# Patient Record
Sex: Female | Born: 1955 | Race: Black or African American | Hispanic: No | Marital: Single | Smoking: Never smoker
Health system: Southern US, Community
[De-identification: ages and names within clinical notes are randomized; demographics above are authoritative.]

## PROBLEM LIST (undated history)

## (undated) DIAGNOSIS — D219 Benign neoplasm of connective and other soft tissue, unspecified: Secondary | ICD-10-CM

## (undated) DIAGNOSIS — D649 Anemia, unspecified: Secondary | ICD-10-CM

## (undated) DIAGNOSIS — I10 Essential (primary) hypertension: Secondary | ICD-10-CM

## (undated) HISTORY — DX: Anemia, unspecified: D64.9

## (undated) HISTORY — DX: Benign neoplasm of connective and other soft tissue, unspecified: D21.9

## (undated) HISTORY — DX: Essential (primary) hypertension: I10

---

## 1999-07-16 ENCOUNTER — Emergency Department (HOSPITAL_COMMUNITY): Admission: EM | Admit: 1999-07-16 | Discharge: 1999-07-16 | Payer: Self-pay | Admitting: Emergency Medicine

## 2003-02-24 ENCOUNTER — Ambulatory Visit (HOSPITAL_COMMUNITY): Admission: RE | Admit: 2003-02-24 | Discharge: 2003-02-24 | Payer: Self-pay | Admitting: *Deleted

## 2011-04-10 ENCOUNTER — Other Ambulatory Visit (HOSPITAL_COMMUNITY)
Admission: RE | Admit: 2011-04-10 | Discharge: 2011-04-10 | Disposition: A | Discharge status: Home / Self Care | Payer: BC Managed Care – PPO | Source: Ambulatory Visit | Attending: Obstetrics and Gynecology | Admitting: Obstetrics and Gynecology

## 2011-04-10 ENCOUNTER — Ambulatory Visit (INDEPENDENT_AMBULATORY_CARE_PROVIDER_SITE_OTHER): Payer: BC Managed Care – PPO | Admitting: Women's Health

## 2011-04-10 VITALS — BP 124/72 | Ht 64.75 in | Wt 196.0 lb

## 2011-04-10 DIAGNOSIS — Z01419 Encounter for gynecological examination (general) (routine) without abnormal findings: Secondary | ICD-10-CM

## 2011-04-10 DIAGNOSIS — N949 Unspecified condition associated with female genital organs and menstrual cycle: Secondary | ICD-10-CM

## 2011-04-10 DIAGNOSIS — N938 Other specified abnormal uterine and vaginal bleeding: Secondary | ICD-10-CM

## 2011-04-10 DIAGNOSIS — Z23 Encounter for immunization: Secondary | ICD-10-CM

## 2011-04-10 NOTE — Patient Instructions (Signed)
Vit d 2000

## 2011-04-10 NOTE — Progress Notes (Signed)
Addended by: [place], Fredy Gladu E on: 04/10/2011 12:55 PM   Modules accepted: Orders

## 2011-04-10 NOTE — Progress Notes (Signed)
Carrie Hess 17-Jan-1956 [id number]    History:    The patient presents for annual exam.  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   Past medical history, past surgical history, family history and social history were all reviewed and documented in the EPIC chart.   ROS:  A  ROS was performed and pertinent positives and negatives are included in the history.  Exam:  Filed Vitals:   04/10/11 1000  BP: 124/72    General appearance:  Normal Head/Neck:  Normal, without cervical or supraclavicular adenopathy. Thyroid:  Symmetrical, normal in size, without palpable masses or nodularity. Respiratory  Effort:  Normal  Auscultation:  Clear without wheezing or rhonchi Cardiovascular  Auscultation:  Regular rate, without rubs, murmurs or gallops  Edema/varicosities:  Not grossly evident Abdominal  Soft,nontender, without masses, guarding or rebound.  Liver/spleen:  No organomegaly noted  Hernia:  None appreciated  Skin  Inspection:  Grossly normal  Palpation:  Grossly normal Neurologic/psychiatric  Orientation:  Normal with appropriate conversation.  Mood/affect:  Normal  Genitourinary    Breasts: Examined lying and sitting.     Right: Without masses, retractions, discharge or axillary adenopathy.     Left: Without masses, retractions, discharge or axillary adenopathy.   Inguinal/mons:  Normal without inguinal adenopathy  External genitalia:  Normal  BUS/Urethra/Skene's glands:  Normal  Bladder:  Normal  Vagina:  Normal  Cervix:  Normal  Uterus:  Enlarged/fibroid 8-10 weeks size, shape and contour.  Midline and mobile  Adnexa/parametria:     Rt: Without masses or tenderness.   Lt: Without masses or tenderness.  Anus and perineum: Normal  Digital rectal exam: Normal sphincter tone without palpated masses or tenderness  Assessment/Plan:  55 y.o. SBF G0 for annual exam. Postmenopausal for greater than 2 years with occasional bleeding with exercise mostly. No HRT, tolerating symptoms  well. Moved here from [place]. Normal colonoscopy in 2009, normal DEXA in 2012.Tdap vaccine several years ago. History of all normal mammograms and Paps.  Postmenopausal bleeding/fibroids Hypertension/primary care labs and meds.  Plan: TSH, Pap. Sonohysterogram with Dr. Audie Box, will schedule. SBEs, annual mammogram, repeat DEXA next year. Continue exercise, vitamin D 2000 daily , calcium rich diet encouraged. Continue healthy lifestyle, decrease calories for weight loss.   Harrington Challenger WHNP, 11:15 AM 04/10/2011

## 2011-04-17 ENCOUNTER — Other Ambulatory Visit: Payer: Self-pay | Admitting: Gynecology

## 2011-04-17 DIAGNOSIS — E039 Hypothyroidism, unspecified: Secondary | ICD-10-CM

## 2011-04-18 LAB — TSH: TSH: 0.483 u[IU]/mL (ref 0.350–4.500)

## 2013-01-03 ENCOUNTER — Ambulatory Visit: Payer: BC Managed Care – PPO

## 2013-01-03 ENCOUNTER — Telehealth: Payer: Self-pay | Admitting: Radiology

## 2013-01-03 ENCOUNTER — Ambulatory Visit (INDEPENDENT_AMBULATORY_CARE_PROVIDER_SITE_OTHER): Payer: BC Managed Care – PPO | Admitting: Family Medicine

## 2013-01-03 VITALS — BP 116/80 | HR 80 | Temp 98.6°F | Resp 18 | Ht 64.5 in | Wt 194.0 lb

## 2013-01-03 DIAGNOSIS — J189 Pneumonia, unspecified organism: Secondary | ICD-10-CM

## 2013-01-03 DIAGNOSIS — R079 Chest pain, unspecified: Secondary | ICD-10-CM

## 2013-01-03 LAB — POCT CBC
Granulocyte percent: 77 %G (ref 37–80)
HCT, POC: 39.2 % (ref 37.7–47.9)
Hemoglobin: 12.4 g/dL (ref 12.2–16.2)
Lymph, poc: 2.3 (ref 0.6–3.4)
MCH, POC: 29.3 pg (ref 27–31.2)
MCHC: 31.6 g/dL — AB (ref 31.8–35.4)
MCV: 92.7 fL (ref 80–97)
MID (cbc): 0.8 (ref 0–0.9)
MPV: 9 fL (ref 0–99.8)
POC Granulocyte: 10.4 — AB (ref 2–6.9)
POC LYMPH PERCENT: 17.1 %L (ref 10–50)
POC MID %: 5.9 %M (ref 0–12)
Platelet Count, POC: 293 10*3/uL (ref 142–424)
RBC: 4.23 M/uL (ref 4.04–5.48)
RDW, POC: 14.5 %
WBC: 13.5 10*3/uL — AB (ref 4.6–10.2)

## 2013-01-03 LAB — GLUCOSE, POCT (MANUAL RESULT ENTRY): POC Glucose: 97 mg/dl (ref 70–99)

## 2013-01-03 MED ORDER — IBUPROFEN 600 MG PO TABS: 600.0000 mg | ORAL_TABLET | Freq: Three times a day (TID) | ORAL | Status: DC | PRN

## 2013-01-03 MED ORDER — CEFDINIR 300 MG PO CAPS: 300.0000 mg | ORAL_CAPSULE | Freq: Two times a day (BID) | ORAL | Status: DC

## 2013-01-03 NOTE — Telephone Encounter (Signed)
IMPRESSION:  1. Right middle lobe air space disease is suspicious for  pneumonia.  2. Nodular densities in both lung bases, most prominent on the  right.  3. Follow-up to clearing of the above findings is strongly  recommended, as clinically indicated, as underlying malignancy  cannot be excluded. These results will be called to the ordering  clinician or representative by the Radiologist Assistant, and  communication documented in the PACS Dashboard.  Clinically significant discrepancy from primary report, if  provided: None    Call report from Radiolology

## 2013-01-03 NOTE — Progress Notes (Signed)
[facility] [address], [place] [address] [phone]  Date:  01/03/2013   Name:  Carrie Hess   DOB:  17-Mar-1956   MRN:  [id number]  PCP:  Leodis Binet, DO    Chief Complaint: Chest Pain and Cough   History of Present Illness:  Carrie Hess is a 57 y.o. very pleasant female patient who presents with the following:  Today is Monday. Here with concern of chest pain for the last few days.  On Friday she was "not feeling well."  She worked out on Saturday and noted some chest pains AFTER her work- out.  During her work- out she felt like her legs were heavy and she could not do her usual routine.  She felt SOB after she finished.  The pain lasted for a couple of hours.  She felt like she needed to burp.  She felt that maybe her food just did not sit well.  She burped and felt better.    Laying on her side seemed to make the pain worse. She also noted body aches, fever and chills (subjective fever) on Saturday.   Last night she continued to feel bad, drank lots of water.  Took a baby aspirin.  She felt "strange," like she could not digest her food and needed to sit down.  She continued to have a "slight" chest pain.  She has noted some sneezing, but no cough unless she makes herself cough.  She does cough up some mucus.   She does not feel SOB at rest, but does feel that her energy is lower than usual.  She does have a history of HTN.  No history of CAD.  She is not quite sure what her BP medication is- called her pharm- she is on diovan HCT No family history of heart trouble.  She has never been a smoker.  No chest pain at present.   She usually runs 2 or 3 miles several times a week without CP or other difficulty.    There are no active problems to display for this patient.   Past Medical History  Diagnosis Date  . Hypertension   . Fibroid   . Anemia     History reviewed. No pertinent past surgical history.  History  Substance Use Topics   . Smoking status: Never Smoker   . Smokeless tobacco: Never Used  . Alcohol Use: Yes    Family History  Problem Relation Age of Onset  . Hypertension Mother   . Hyperlipidemia Mother   . Hypertension Father   . Diabetes Paternal Uncle   . Leukemia Paternal Uncle     No Known Allergies  Medication list has been reviewed and updated.  Current Outpatient Prescriptions on File Prior to Visit  Medication Sig Dispense Refill  . hydrochlorothiazide (MICROZIDE) 12.5 MG capsule Take 12.5 mg by mouth daily.         No current facility-administered medications on file prior to visit.    Review of Systems:  As per HPI- otherwise negative.   Physical Examination: Filed Vitals:   01/03/13 0811  BP: 116/80  Pulse: 84  Temp: 98.6 F (37 C)  Resp: 18   Filed Vitals:   01/03/13 0811  Height: 5' 4.5" (1.638 m)  Weight: 194 lb (87.998 kg)   Body mass index is 32.8 kg/(m^2). Ideal Body Weight: Weight in (lb) to have BMI = 25: 147.6  GEN: WDWN, NAD, Non-toxic, A & O  x 3, overweight, looks well HEENT: Atraumatic, Normocephalic. Neck supple. No masses, No LAD.  Bilateral TM wnl, oropharynx normal.  PEERL,EOMI.   Ears and Nose: No external deformity. CV: RRR, No M/G/R. No JVD. No thrill. No extra heart sounds. PULM: CTA B, no wheezes, crackles, rhonchi. No retractions. No resp. distress. No accessory muscle use. ABD: S, NT, ND EXTR: No c/c/e NEURO Normal gait.  PSYCH: Normally interactive. Conversant. Not depressed or anxious appearing.  Calm demeanor.   EKG: no old available for comparison.  Normal EKG, no ST elevation or depression  [facility] reading (PRIMARY) by  Dr. Patsy Lager. CXR: RLL pneumonia  IMPRESSION:  1. Right middle lobe air space disease is suspicious for  pneumonia.  2. Nodular densities in both lung bases, most prominent on the  right.  3. Follow-up to clearing of the above findings is strongly  recommended, as clinically indicated, as underlying malignancy   cannot be excluded. These results will be called to the ordering  clinician or representative by the Radiologist Assistant, and  communication documented in the PACS Dashboard.  Clinically significant discrepancy from primary report, if  provided: None    Results for orders placed in visit on 01/03/13  POCT CBC      Result Value Range   WBC 13.5 (*) 4.6 - 10.2 K/uL   Lymph, poc 2.3  0.6 - 3.4   POC LYMPH PERCENT 17.1  10 - 50 %L   MID (cbc) 0.8  0 - 0.9   POC MID % 5.9  0 - 12 %M   POC Granulocyte 10.4 (*) 2 - 6.9   Granulocyte percent 77.0  37 - 80 %G   RBC 4.23  4.04 - 5.48 M/uL   Hemoglobin 12.4  12.2 - 16.2 g/dL   HCT, POC 16.1  09.6 - 47.9 %   MCV 92.7  80 - 97 fL   MCH, POC 29.3  27 - 31.2 pg   MCHC 31.6 (*) 31.8 - 35.4 g/dL   RDW, POC 04.5     Platelet Count, POC 293  142 - 424 K/uL   MPV 9.0  0 - 99.8 fL  GLUCOSE, POCT (MANUAL RESULT ENTRY)      Result Value Range   POC Glucose 97  70 - 99 mg/dl    Assessment and Plan: CAP (community acquired pneumonia) - Plan: cefdinir (OMNICEF) 300 MG capsule  Chest pain - Plan: POCT CBC, POCT glucose (manual entry), DG Chest 2 View, EKG 12-Lead, ibuprofen (ADVIL,MOTRIN) 600 MG tablet  Pneumonia as likely cuase of her malaise and chest pain.  Discussed CXR as above and recommended that she have a repeat in one month.  Treat with omnicef for pneumonia.   Discussed less likely possibility of cardiac disease and offered to have her seen at the ER.  She declined at this time and feels comfortable with treatment for penumonia.    Signed Abbe Amsterdam, MD

## 2013-01-03 NOTE — Patient Instructions (Addendum)
You have pneumonia.  Please use the antibiotic as directed.  Let us know if you are not better, or if you are getting worse!   Remember to have a repeat chest- x-ray in about one month.  We can do this, or you can ask Dr. Ike Bene to do this for you.

## 2013-02-09 ENCOUNTER — Ambulatory Visit (INDEPENDENT_AMBULATORY_CARE_PROVIDER_SITE_OTHER): Payer: BC Managed Care – PPO | Admitting: Emergency Medicine

## 2013-02-09 ENCOUNTER — Ambulatory Visit: Payer: BC Managed Care – PPO

## 2013-02-09 VITALS — BP 134/87 | HR 63 | Temp 98.0°F | Resp 16 | Ht 64.5 in | Wt 196.0 lb

## 2013-02-09 DIAGNOSIS — Z23 Encounter for immunization: Secondary | ICD-10-CM

## 2013-02-09 DIAGNOSIS — J189 Pneumonia, unspecified organism: Secondary | ICD-10-CM

## 2013-02-09 MED ORDER — VITAMIN D (ERGOCALCIFEROL) 1.25 MG (50000 UNIT) PO CAPS: 50000.0000 [IU] | ORAL_CAPSULE | ORAL | Status: DC

## 2013-02-09 NOTE — Progress Notes (Signed)
[facility] [address], [place] [address] [phone]  Date:  02/09/2013   Name:  Carrie Hess   DOB:  05-03-1956   MRN:  [id number]  PCP:  Leodis Binet, DO    Chief Complaint: Follow-up   History of Present Illness:  Carrie Hess is a 57 y.o. very pleasant female patient who presents with the following:  Seen a month ago with Right pneumonia.  Completed her course of antibiotic and her symptoms have resolved.  No chest pain, shortness of breath, wheezing.  No fever or chills.  "feel fantastic".  Denies other complaint or health concern today.   There are no active problems to display for this patient.   Past Medical History  Diagnosis Date  . Hypertension   . Fibroid   . Anemia     History reviewed. No pertinent past surgical history.  History  Substance Use Topics  . Smoking status: Never Smoker   . Smokeless tobacco: Never Used  . Alcohol Use: Yes    Family History  Problem Relation Age of Onset  . Hypertension Mother   . Hyperlipidemia Mother   . Hypertension Father   . Diabetes Paternal Uncle   . Leukemia Paternal Uncle     No Known Allergies  Medication list has been reviewed and updated.  Current Outpatient Prescriptions on File Prior to Visit  Medication Sig Dispense Refill  . aspirin 81 MG tablet Take 81 mg by mouth daily.      Marland Kitchen ibuprofen (ADVIL,MOTRIN) 600 MG tablet Take 1 tablet (600 mg total) by mouth every 8 (eight) hours as needed for pain.  30 tablet  0  . valsartan-hydrochlorothiazide (DIOVAN-HCT) 80-12.5 MG per tablet Take 1 tablet by mouth daily.      . cefdinir (OMNICEF) 300 MG capsule Take 1 capsule (300 mg total) by mouth 2 (two) times daily.  20 capsule  0   No current facility-administered medications on file prior to visit.    Review of Systems:  As per HPI, otherwise negative.    Physical Examination: Filed Vitals:   02/09/13 0827  BP: 134/87  Pulse: 63  Temp: 98 F (36.7 C)  Resp:  16   Filed Vitals:   02/09/13 0827  Height: 5' 4.5" (1.638 m)  Weight: 196 lb (88.905 kg)   Body mass index is 33.14 kg/(m^2). Ideal Body Weight: Weight in (lb) to have BMI = 25: 147.6  GEN: WDWN, NAD, Non-toxic, A & O x 3 HEENT: Atraumatic, Normocephalic. Neck supple. No masses, No LAD. Ears and Nose: No external deformity. CV: RRR, No M/G/R. No JVD. No thrill. No extra heart sounds. PULM: CTA B, no wheezes, crackles, rhonchi. No retractions. No resp. distress. No accessory muscle use. ABD: S, NT, ND, +BS. No rebound. No HSM. EXTR: No c/c/e NEURO Normal gait.  PSYCH: Normally interactive. Conversant. Not depressed or anxious appearing.  Calm demeanor.    Assessment and Plan: Resolved pneumonia  Signed,  Phillips Odor, MD   [facility] reading (PRIMARY) by  Dr. Dareen Piano.  Negative chest.

## 2014-07-04 ENCOUNTER — Other Ambulatory Visit: Payer: Self-pay | Admitting: Emergency Medicine

## 2014-09-08 IMAGING — CR DG CHEST 2V
2 series · 2 of 2 positions shown · non-contrast
Comparison: None.

CLINICAL DATA: Chest pain, congestion and chills.

CHEST - 2 VIEW

[PA]
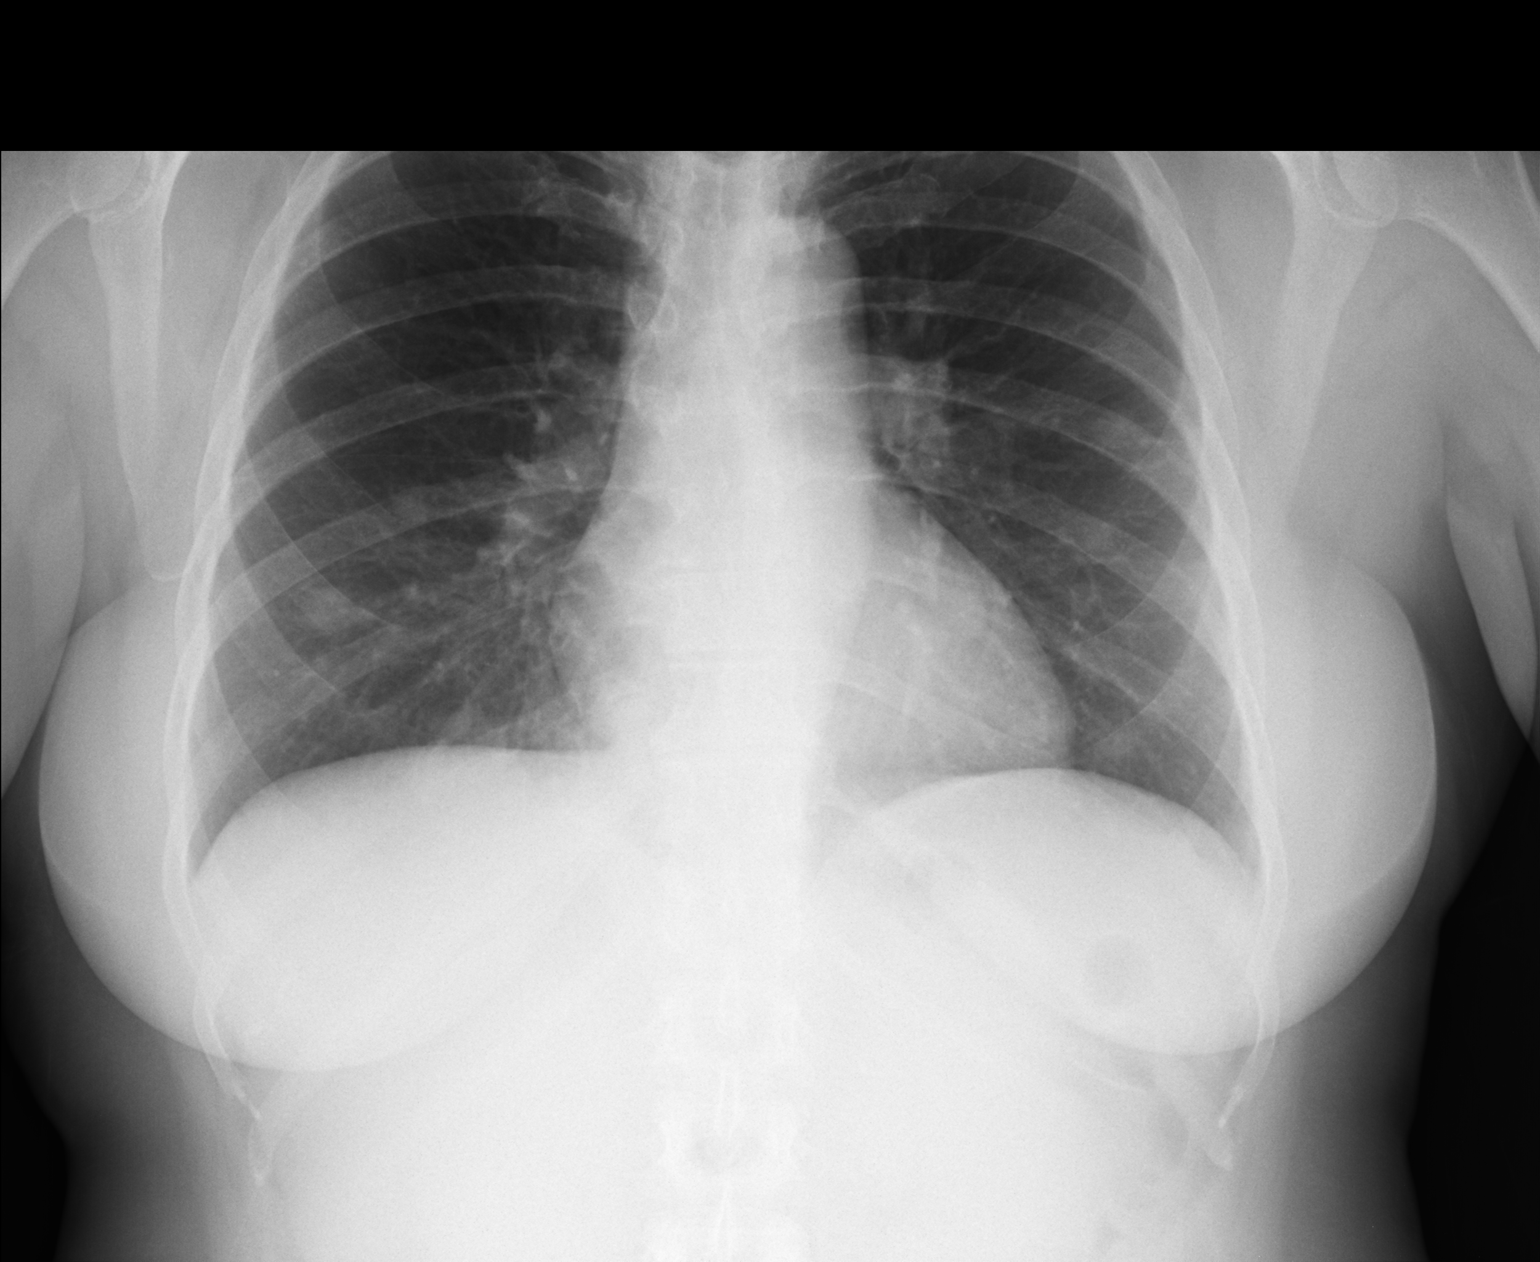

[lateral]
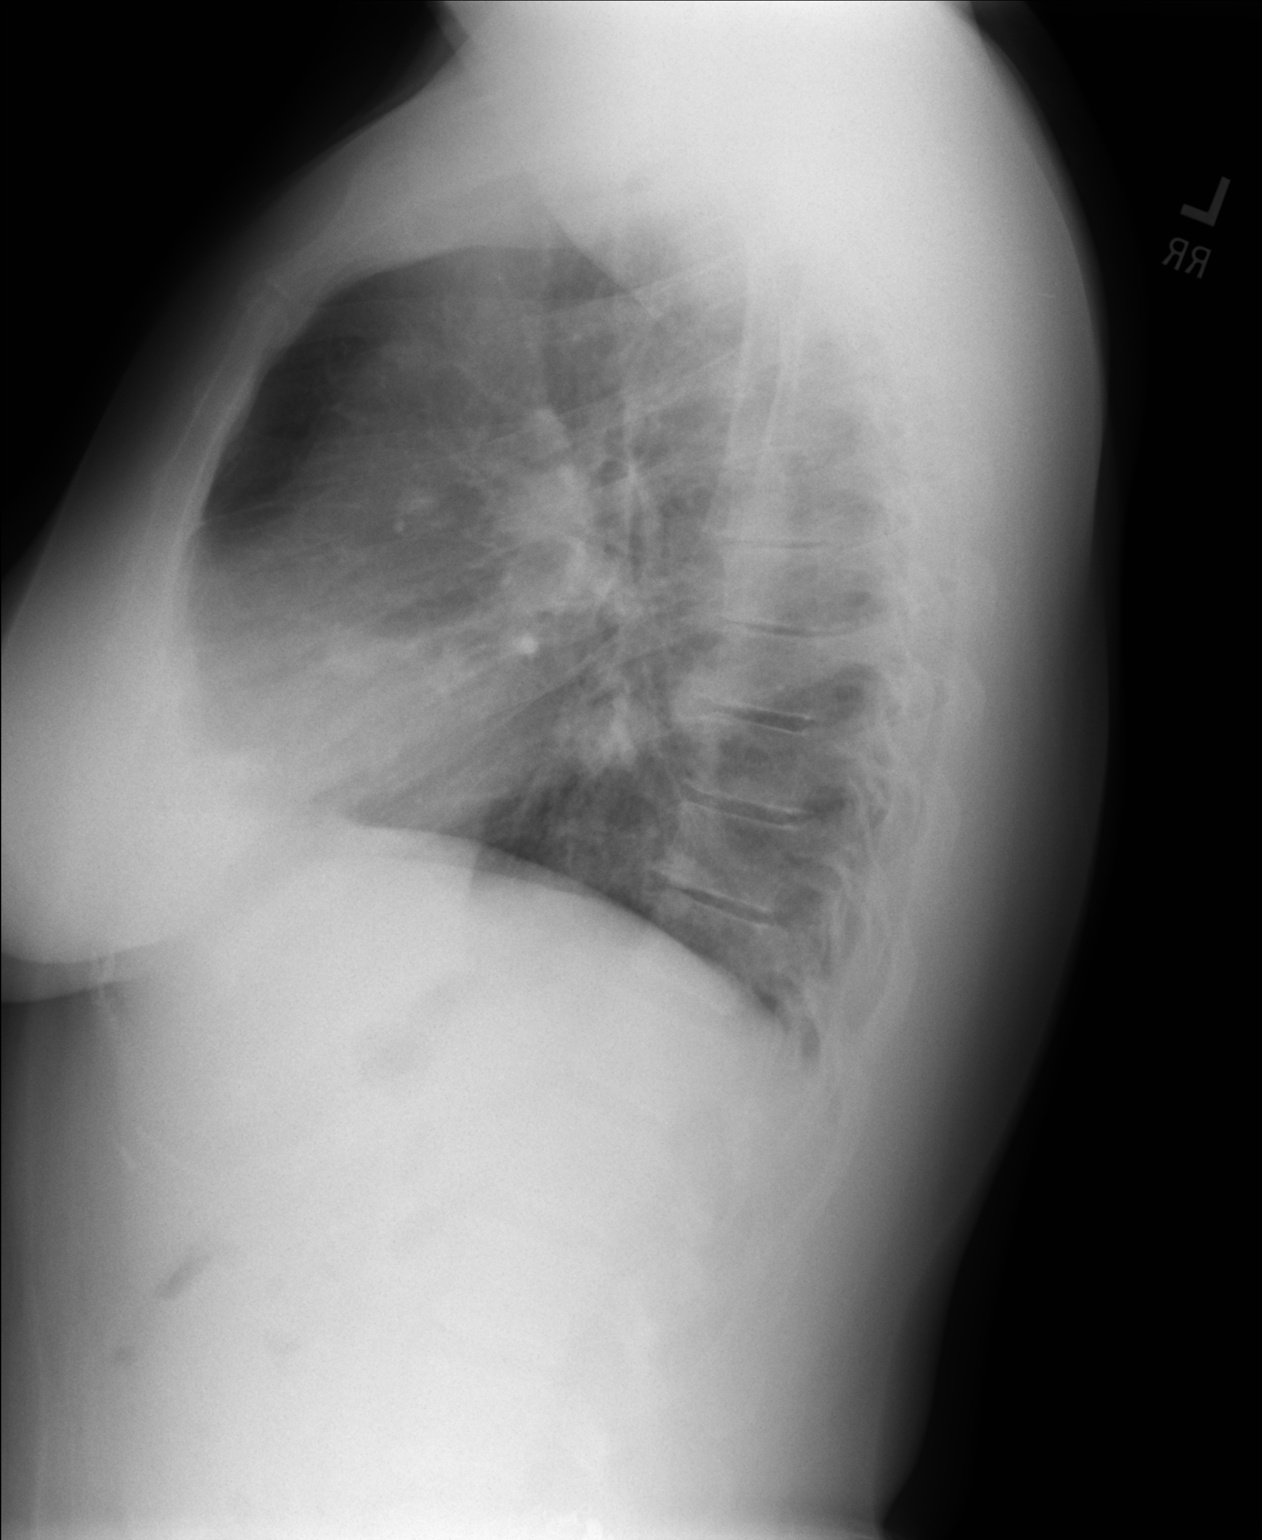

[2 of 2 positions shown; findings below may reference images not displayed]

FINDINGS: Trachea is midline.  Heart size normal.  There is air
space disease in the right middle lobe.  Additionally, nodular
densities are seen at the lung bases, most prominent on the right.
No pleural fluid.
IMPRESSION: 1.  Right middle lobe air space disease is suspicious for
pneumonia.
2.  Nodular densities in both lung bases, most prominent on the
right.
3.  Follow-up to clearing of the above findings is strongly
recommended, as clinically indicated, as underlying malignancy
cannot be excluded. These results will be called to the ordering
clinician or representative by the Radiologist Assistant, and
communication documented in the PACS Dashboard.

Clinically significant discrepancy from primary report, if
provided: None

## 2015-07-25 ENCOUNTER — Ambulatory Visit (INDEPENDENT_AMBULATORY_CARE_PROVIDER_SITE_OTHER): Payer: BC Managed Care – PPO | Admitting: Urgent Care

## 2015-07-25 ENCOUNTER — Ambulatory Visit (INDEPENDENT_AMBULATORY_CARE_PROVIDER_SITE_OTHER): Payer: BC Managed Care – PPO

## 2015-07-25 VITALS — BP 140/90 | HR 56 | Temp 98.2°F | Resp 18 | Ht 64.0 in | Wt 202.1 lb

## 2015-07-25 DIAGNOSIS — R0989 Other specified symptoms and signs involving the circulatory and respiratory systems: Secondary | ICD-10-CM

## 2015-07-25 DIAGNOSIS — R05 Cough: Secondary | ICD-10-CM

## 2015-07-25 DIAGNOSIS — J069 Acute upper respiratory infection, unspecified: Secondary | ICD-10-CM

## 2015-07-25 DIAGNOSIS — R059 Cough, unspecified: Secondary | ICD-10-CM

## 2015-07-25 NOTE — Patient Instructions (Addendum)
- You can try Cold-Eeze although it will typically help most if you start it when you first have symptoms.  Upper Respiratory Infection, Adult Most upper respiratory infections (URIs) are a viral infection of the air passages leading to the lungs. A URI affects the nose, throat, and upper air passages. The most common type of URI is nasopharyngitis and is typically referred to as "the common cold." URIs run their course and usually go away on their own. Most of the time, a URI does not require medical attention, but sometimes a bacterial infection in the upper airways can follow a viral infection. This is called a secondary infection. Sinus and middle ear infections are common types of secondary upper respiratory infections. Bacterial pneumonia can also complicate a URI. A URI can worsen asthma and chronic obstructive pulmonary disease (COPD). Sometimes, these complications can require emergency medical care and may be life threatening.  CAUSES Almost all URIs are caused by viruses. A virus is a type of germ and can spread from one person to another.  RISKS FACTORS You may be at risk for a URI if:   You smoke.   You have chronic heart or lung disease.  You have a weakened defense (immune) system.   You are very young or very old.   You have nasal allergies or asthma.  You work in crowded or poorly ventilated areas.  You work in health care facilities or schools. SIGNS AND SYMPTOMS  Symptoms typically develop 2-3 days after you come in contact with a cold virus. Most viral URIs last 7-10 days. However, viral URIs from the influenza virus (flu virus) can last 14-18 days and are typically more severe. Symptoms may include:   Runny or stuffy (congested) nose.   Sneezing.   Cough.   Sore throat.   Headache.   Fatigue.   Fever.   Loss of appetite.   Pain in your forehead, behind your eyes, and over your cheekbones (sinus pain).  Muscle aches.  DIAGNOSIS  Your  health care provider may diagnose a URI by:  Physical exam.  Tests to check that your symptoms are not due to another condition such as:  Strep throat.  Sinusitis.  Pneumonia.  Asthma. TREATMENT  A URI goes away on its own with time. It cannot be cured with medicines, but medicines may be prescribed or recommended to relieve symptoms. Medicines may help:  Reduce your fever.  Reduce your cough.  Relieve nasal congestion. HOME CARE INSTRUCTIONS   Take medicines only as directed by your health care provider.   Gargle warm saltwater or take cough drops to comfort your throat as directed by your health care provider.  Use a warm mist humidifier or inhale steam from a shower to increase air moisture. This may make it easier to breathe.  Drink enough fluid to keep your urine clear or pale yellow.   Eat soups and other clear broths and maintain good nutrition.   Rest as needed.   Return to work when your temperature has returned to normal or as your health care provider advises. You may need to stay home longer to avoid infecting others. You can also use a face mask and careful hand washing to prevent spread of the virus.  Increase the usage of your inhaler if you have asthma.   Do not use any tobacco products, including cigarettes, chewing tobacco, or electronic cigarettes. If you need help quitting, ask your health care provider. PREVENTION  The best way to protect  yourself from getting a cold is to practice good hygiene.   Avoid oral or hand contact with people with cold symptoms.   Wash your hands often if contact occurs.  There is no clear evidence that vitamin C, vitamin E, echinacea, or exercise reduces the chance of developing a cold. However, it is always recommended to get plenty of rest, exercise, and practice good nutrition.  SEEK MEDICAL CARE IF:   You are getting worse rather than better.   Your symptoms are not controlled by medicine.   You have  chills.  You have worsening shortness of breath.  You have brown or red mucus.  You have yellow or brown nasal discharge.  You have pain in your face, especially when you bend forward.  You have a fever.  You have swollen neck glands.  You have pain while swallowing.  You have white areas in the back of your throat. SEEK IMMEDIATE MEDICAL CARE IF:   You have severe or persistent:  Headache.  Ear pain.  Sinus pain.  Chest pain.  You have chronic lung disease and any of the following:  Wheezing.  Prolonged cough.  Coughing up blood.  A change in your usual mucus.  You have a stiff neck.  You have changes in your:  Vision.  Hearing.  Thinking.  Mood. MAKE SURE YOU:   Understand these instructions.  Will watch your condition.  Will get help right away if you are not doing well or get worse.   This information is not intended to replace advice given to you by your health care provider. Make sure you discuss any questions you have with your health care provider.   Document Released: 10/22/2000 Document Revised: 09/12/2014 Document Reviewed: 08/03/2013 Elsevier Interactive Patient Education [employer].

## 2015-07-25 NOTE — Progress Notes (Signed)
    MRN: [id number] DOB: 20-Sep-1955  Subjective:   Carrie Hess is a 60 y.o. female presenting for chief complaint of r/o pneumonia and chest congestion  Reports 4 day history of chest congestion, mild cough, subjective fever, chills, runny nose. Has tried Alka-seltzer cold, throat lozenges, coricidin. Admits history of pneumonia in 2014, wants to make sure this is not the case. Denies chest pain, shob, wheezing, fatigue, sore throat, sinus pain. Denies smoking cigarettes. Denies history of asthma. Is compliant with her BP medications. Denies family history of cancer.  Carrie Hess has a current medication list which includes the following prescription(s): valsartan-hydrochlorothiazide. Also has No Known Allergies.  Carrie Hess  has a past medical history of Hypertension; Fibroid; and Anemia. Also  has no past surgical history on file.  Objective:   Vitals: BP 140/90 mmHg  Pulse 56  Temp(Src) 98.2 F (36.8 C) (Oral)  Resp 18  Ht 5\' 4"  (1.626 m)  Wt 202 lb 1.6 oz (91.672 kg)  BMI 34.67 kg/m2  SpO2 94%  LMP 07/09/2008  Physical Exam  Constitutional: She is oriented to person, place, and time. She appears well-developed and well-nourished.  HENT:  TM's intact bilaterally, no effusions or erythema. Nasal turbinates pink and moist, nasal passages patent. No sinus tenderness. Oropharynx with post-nasal drainage, mucous membranes moist, dentition in good repair.  Eyes: Right eye exhibits no discharge. Left eye exhibits no discharge. No scleral icterus.  Neck: Normal range of motion. Neck supple.  Cardiovascular: Normal rate, regular rhythm and intact distal pulses.  Exam reveals no gallop and no friction rub.   No murmur heard. Pulmonary/Chest: No respiratory distress. She has no wheezes. She has no rales.  Musculoskeletal: She exhibits no edema.  Lymphadenopathy:    She has no cervical adenopathy.  Neurological: She is alert and oriented to person, place, and time.  Skin: Skin is warm and  dry.   Dg Chest 2 View  07/25/2015  CLINICAL DATA:  Shortness of Breath EXAM: CHEST  2 VIEW COMPARISON:  02/09/2013 FINDINGS: Cardiac shadow is at the upper limits of normal in size. Inspiratory effort on the frontal film is low. No focal infiltrate or sizable effusion is seen. Mild degenerative changes of the thoracic spine are noted. IMPRESSION: No acute abnormality noted. Electronically Signed   By: Inez Catalina M.D.   On: 07/25/2015 14:59   Assessment and Plan :   1. Acute upper respiratory infection 2. Chest congestion 3. Cough - Physical exam and chest x-ray findings reassuring, advised supportive care. RTC in 1 week if no improvement.  Carrie Eagles, PA-C [facility] [phone] 07/25/2015 2:37 PM

## 2017-03-29 IMAGING — CR DG CHEST 2V
3 series · 3 of 3 positions shown · non-contrast
Comparison: 02/09/2013

CLINICAL DATA: Shortness of Breath

EXAM:
CHEST  2 VIEW

[PA (1 of 2)]
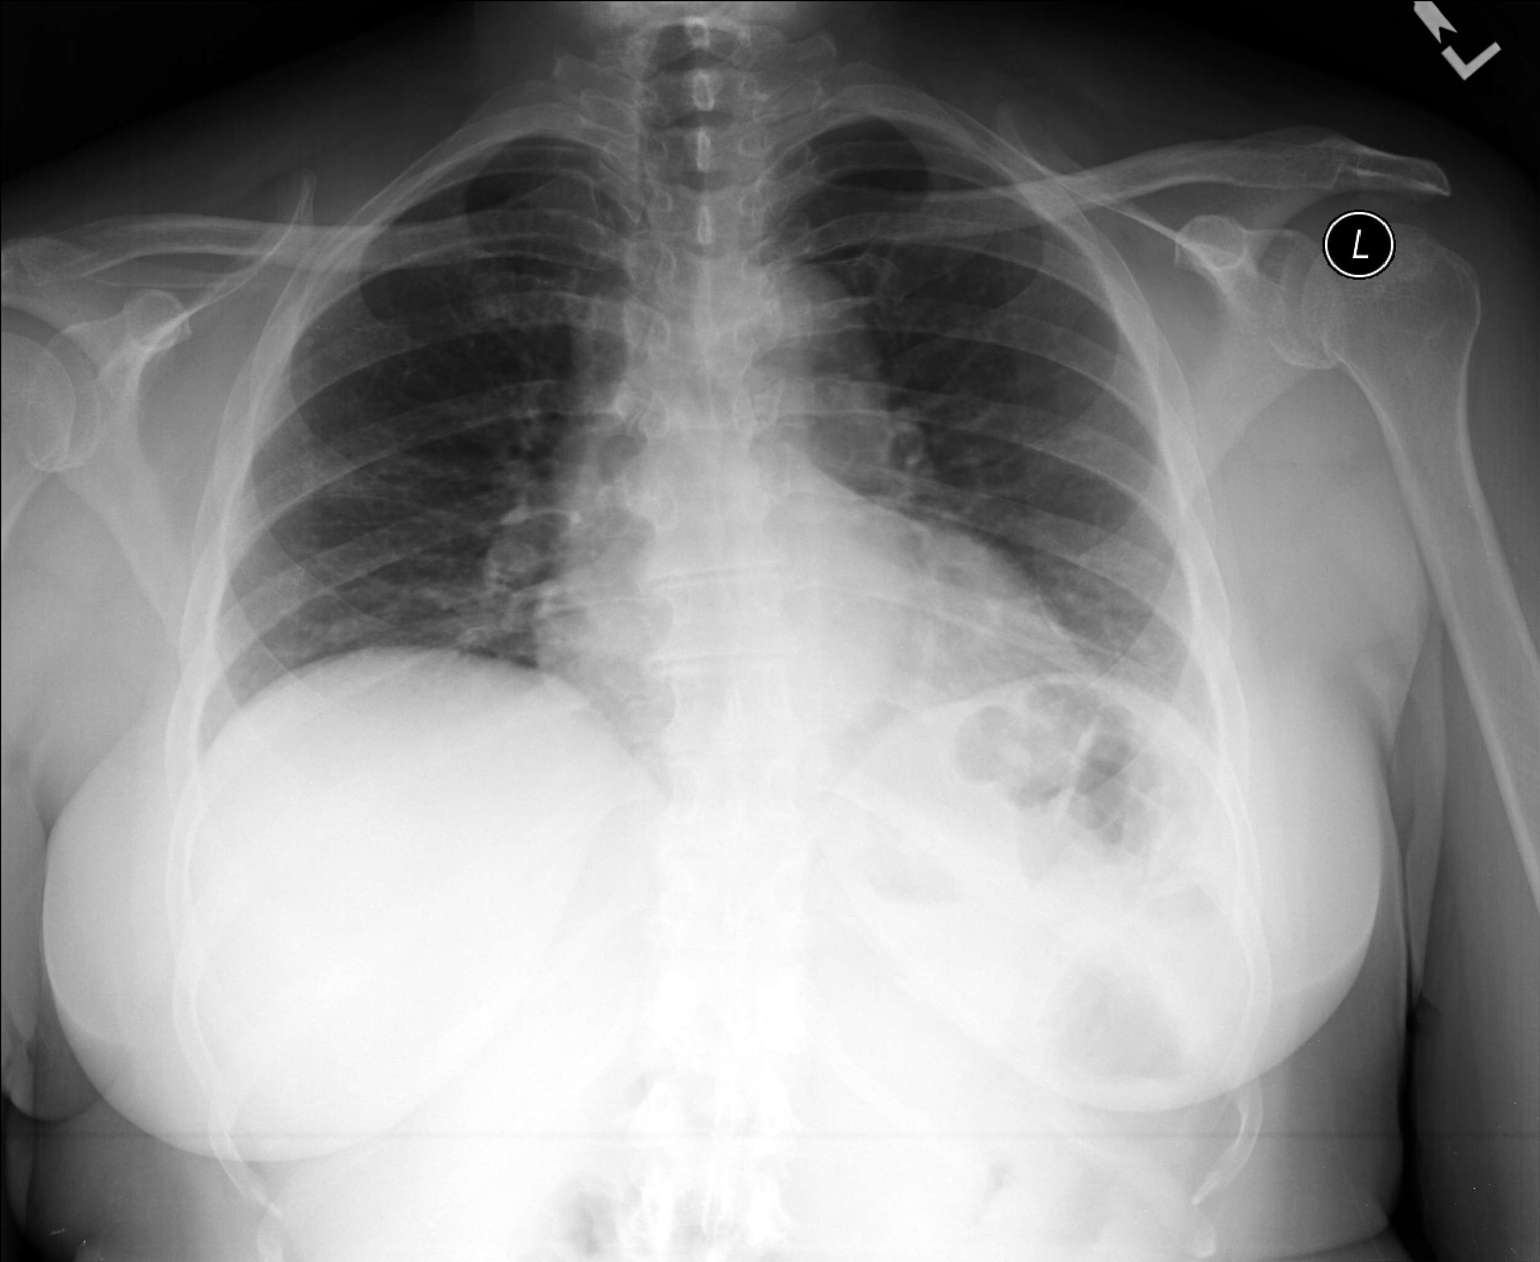

[lateral]
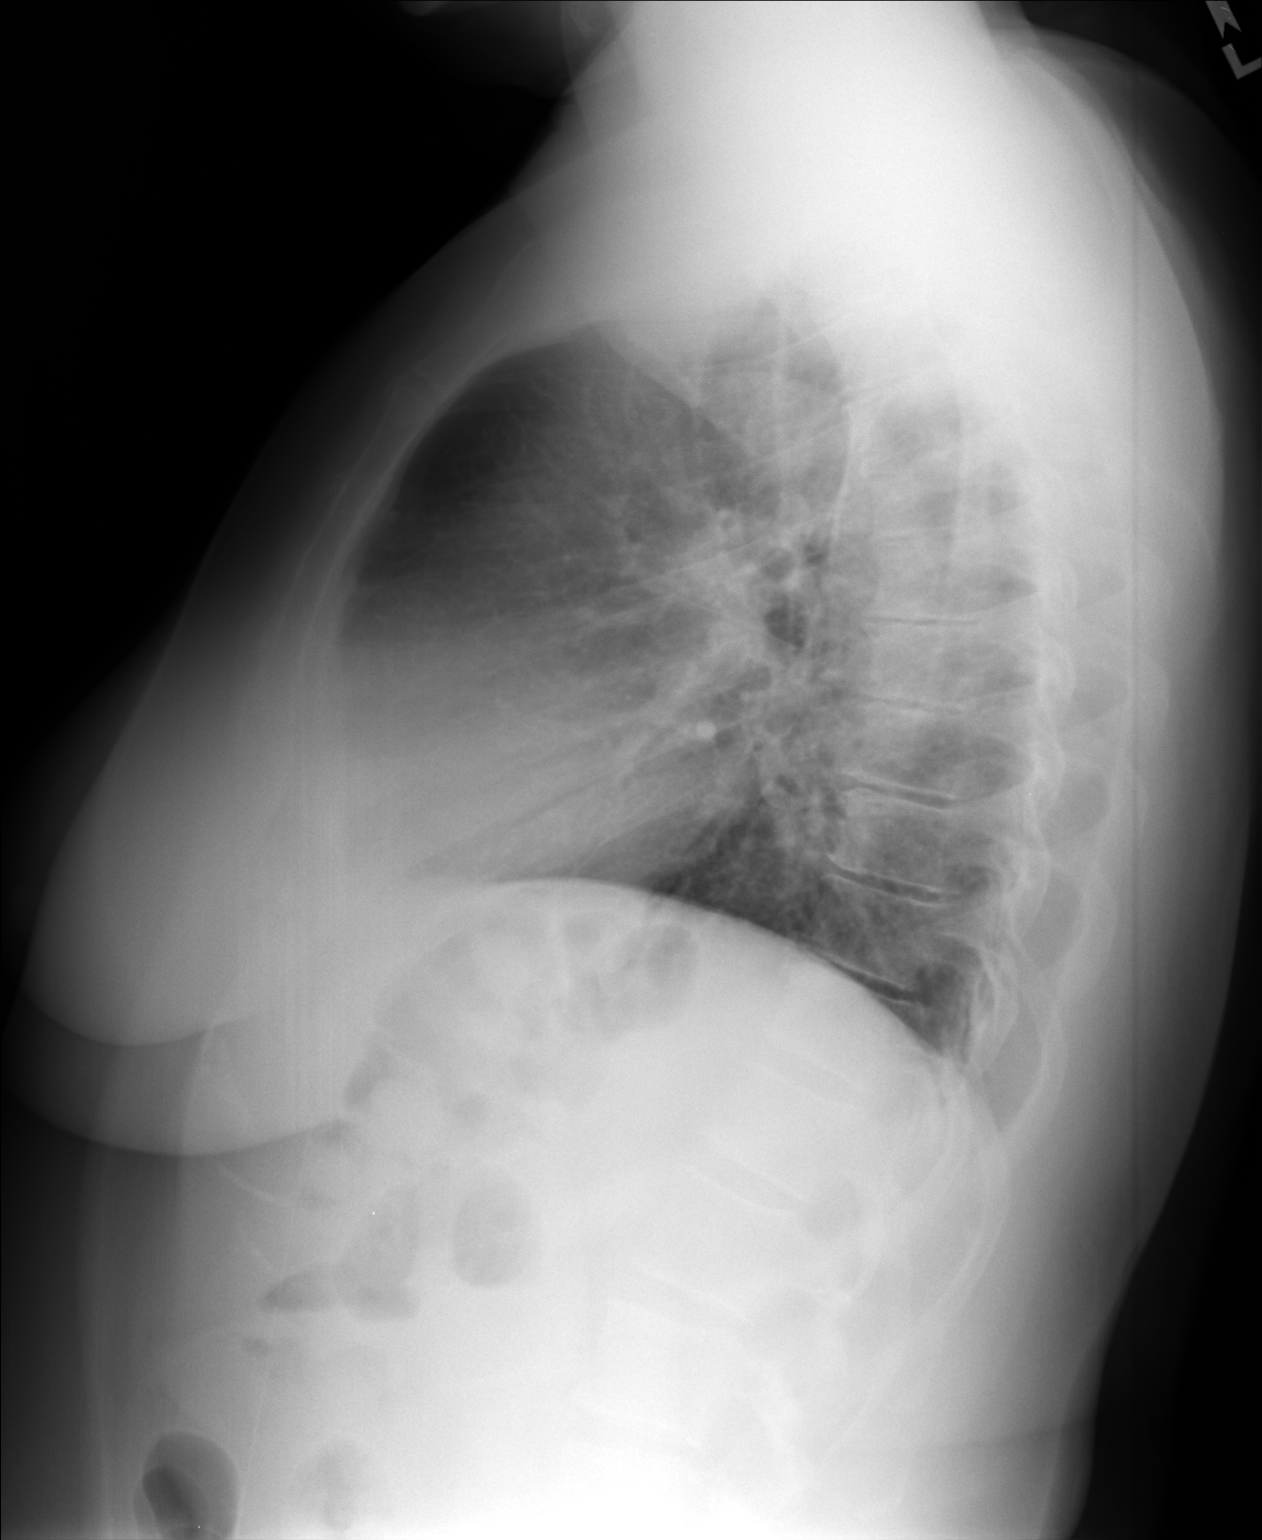

[PA (2 of 2)]
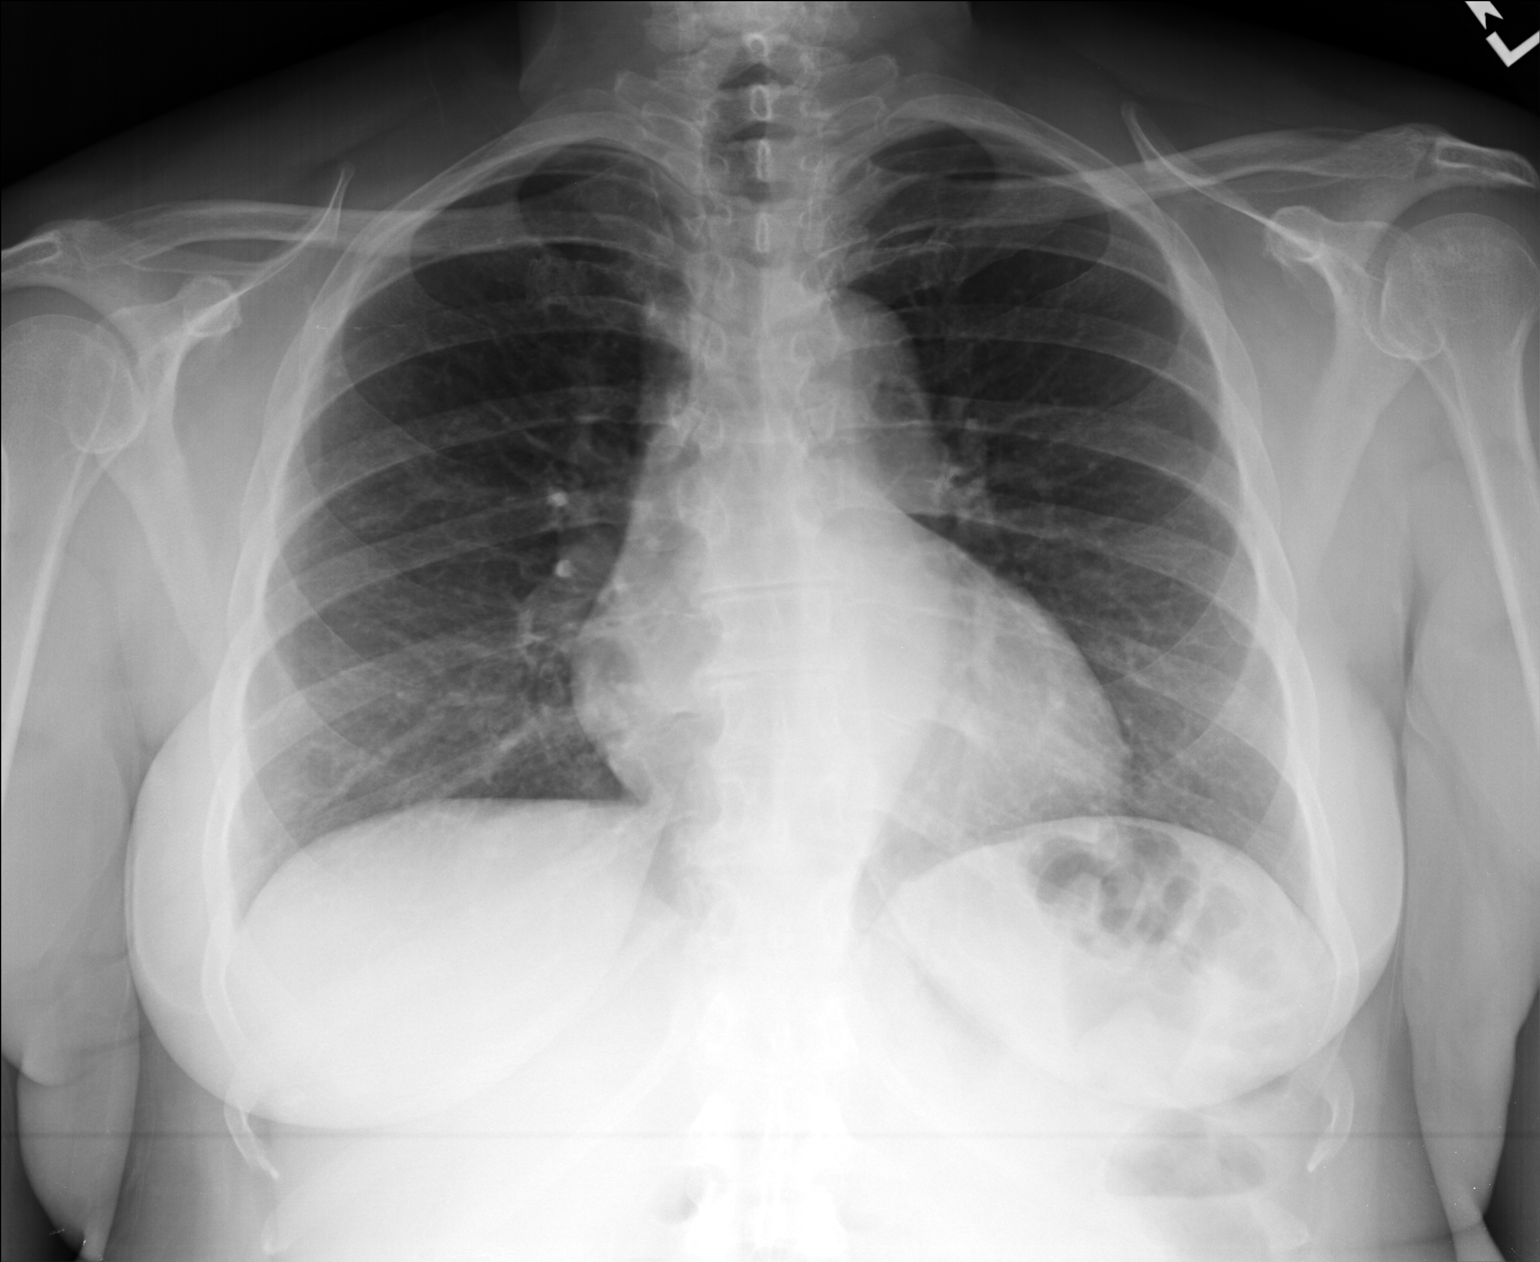

[3 of 3 positions shown; findings below may reference images not displayed]

FINDINGS: Cardiac shadow is at the upper limits of normal in size. Inspiratory
effort on the frontal film is low. No focal infiltrate or sizable
effusion is seen. Mild degenerative changes of the thoracic spine
are noted.
IMPRESSION: No acute abnormality noted.

## 2017-06-17 ENCOUNTER — Telehealth: Payer: Self-pay | Admitting: Physician Assistant

## 2017-06-17 ENCOUNTER — Encounter: Payer: Self-pay | Admitting: Physician Assistant

## 2017-06-17 ENCOUNTER — Other Ambulatory Visit: Payer: Self-pay

## 2017-06-17 ENCOUNTER — Ambulatory Visit: Payer: BC Managed Care – PPO | Admitting: Physician Assistant

## 2017-06-17 VITALS — BP 130/92 | HR 70 | Temp 98.0°F | Resp 16 | Ht 64.0 in | Wt 205.0 lb

## 2017-06-17 DIAGNOSIS — R0981 Nasal congestion: Secondary | ICD-10-CM | POA: Diagnosis not present

## 2017-06-17 DIAGNOSIS — S46311A Strain of muscle, fascia and tendon of triceps, right arm, initial encounter: Secondary | ICD-10-CM

## 2017-06-17 DIAGNOSIS — B9789 Other viral agents as the cause of diseases classified elsewhere: Secondary | ICD-10-CM | POA: Diagnosis not present

## 2017-06-17 DIAGNOSIS — J329 Chronic sinusitis, unspecified: Secondary | ICD-10-CM

## 2017-06-17 DIAGNOSIS — M79601 Pain in right arm: Secondary | ICD-10-CM | POA: Diagnosis not present

## 2017-06-17 MED ORDER — GUAIFENESIN ER 1200 MG PO TB12: 1.0000 | ORAL_TABLET | Freq: Two times a day (BID) | ORAL | 1 refills | Status: AC | PRN

## 2017-06-17 MED ORDER — TROLAMINE SALICYLATE 10 % EX CREA: 1.0000 "application " | TOPICAL_CREAM | CUTANEOUS | 0 refills | Status: AC | PRN

## 2017-06-17 MED ORDER — IPRATROPIUM BROMIDE 0.03 % NA SOLN: 2.0000 | Freq: Two times a day (BID) | NASAL | 0 refills | Status: AC

## 2017-06-17 NOTE — Telephone Encounter (Signed)
Copied from [place]ly [phone]. Topic: General - Other >> Jun 17, 2017  3:38 PM Cecelia Byars, NT wrote: Reason for CRM: Patient called and said she does not want medicine over the counter , she was told she would have a prescription pain medicine cream called in to the pharmacy ,and also medicine similar to mucinex ,but prescription   for both neither were called in ,she is  very upset  ,please call her once this is done at [phone]

## 2017-06-17 NOTE — Progress Notes (Signed)
   Carrie Hess  MRN: [id number] DOB: 09/22/1955  PCP: Vonna Drafts, DO  Subjective:  Pt is a 62 year old female PMH HTN who presents to clinic for nasal congestion x 4 days. Endorses "runny eyes".   Fullness in the head with some puffiness of eyes.   Denies sore throat, headache, cough, fever, chills.  She has been taking zyrtec, zicam, warm salt water gargles and neti pot.   Right upper arm pain x 2 weeks. No MOI. Tender to touch posterior upper arm. Hurts worse to put bra on.   Review of Systems  Constitutional: Negative for chills, diaphoresis, fatigue and fever.  HENT: Positive for congestion, postnasal drip and rhinorrhea. Negative for sinus pressure, sinus pain and sore throat.   Respiratory: Negative for cough, shortness of breath and wheezing.   Psychiatric/Behavioral: Negative for sleep disturbance.    There are no active problems to display for this patient.   Current Outpatient Medications on File Prior to Visit  Medication Sig Dispense Refill  . valsartan-hydrochlorothiazide (DIOVAN-HCT) 80-12.5 MG per tablet Take 1 tablet by mouth daily.     No current facility-administered medications on file prior to visit.     No Known Allergies   Objective:  BP (!) 130/92   Pulse 70   Temp 98 F (36.7 C)   Resp 16   Ht 5\' 4"  (1.626 m)   Wt 205 lb (93 kg)   LMP 07/09/2008   SpO2 95%   BMI 35.19 kg/m   Physical Exam  Constitutional: She is oriented to person, place, and time and well-developed, well-nourished, and in no distress. No distress.  HENT:  Right Ear: Tympanic membrane normal.  Left Ear: Tympanic membrane normal.  Nose: Mucosal edema present. No rhinorrhea. Right sinus exhibits no maxillary sinus tenderness and no frontal sinus tenderness. Left sinus exhibits no maxillary sinus tenderness and no frontal sinus tenderness.  Mouth/Throat: Oropharynx is clear and moist and mucous membranes are normal.  Cardiovascular: Normal rate, regular rhythm and  normal heart sounds.  Pulmonary/Chest: Effort normal and breath sounds normal. No respiratory distress. She has no wheezes. She has no rales.  Musculoskeletal:       Right upper arm: She exhibits tenderness (tricep). She exhibits no bony tenderness, no swelling and no edema.  Feels better with stretching.  No reduced ROM  Neurological: She is alert and oriented to person, place, and time. GCS score is 15.  Skin: Skin is warm and dry.  Psychiatric: Mood, memory, affect and judgment normal.  Vitals reviewed.   Assessment and Plan :  1. Viral sinusitis 2. Nasal congestion - Guaifenesin (MUCINEX MAXIMUM STRENGTH) 1200 MG TB12; Take 1 tablet (1,200 mg total) by mouth every 12 (twelve) hours as needed.  Dispense: 14 tablet; Refill: 1 - ipratropium (ATROVENT) 0.03 % nasal spray; Place 2 sprays into both nostrils 2 (two) times daily.  Dispense: 30 mL; Refill: 0 - Suspect viral rhinosinusitis. Plan to treat supportively. RTC in 7-10 days if no improvement.  3. Strain of right triceps, initial encounter 4. Arm pain, right - trolamine salicylate (ASPERCREME/ALOE) 10 % cream; Apply 1 application topically as needed for muscle pain.  Dispense: 85 g; Refill: 0 - suspect muscle strain. Advised stretching, heat, massage. RTC if no improvement in 2-3 weeks.   Mercer Pod, PA-C  [facility] 06/17/2017 2:04 PM

## 2017-06-17 NOTE — Patient Instructions (Addendum)
Start taking Mucinex twice daily. Be sure to drink at least 32 oz of water daily while taking this.  Put a humidifier in your room at night while you are sleeping.  Continue neti pot.  Azelastine nasal spray will help reduce inflammation in your nasal passages.  Take a hot shower in the morning to help clear out your nasal passages.  Come back in 7-10 days if you are not better.   ACUTE VIRAL RHINOSINUSITIS-Patients with acute viral rhinosinusitis (AVRS) should be managed with supportive care. There are no treatments to shorten the clinical course of the disease.  Natural history-AVRS may not completely resolve within 10 days but is expected to improve. Patients who fail to improve after ?10 days of symptomatic management are more likely to have acute bacterial rhinosinusitis. Symptomatic therapies-Symptomatic management of acute rhinosinusitis (ARS) aims to relieve symptoms of nasal obstruction and runny nose as well as the systemic signs and symptoms such as fever and fatigue. When needed, we suggest over-the-counter (OTC) analgesics and antipyretics, saline irrigation, and intranasal glucocorticoids for symptomatic management in patients with ARS. Analgesics analgesics such as nonsteroidal anti-inflammatory drugs and acetaminophen can be used for pain and fever relief as needed. Saline irrigation-Mechanical irrigation with saline may reduce the need for pain medication and improve overall patient comfort, particularly in patients with frequent sinus infections. It is important that irrigants be prepared from sterile or bottled water. (See below for instructions)     SALINE NASAL IRRIGATION  The benefits  1. Saline (saltwater) washes the mucus and irritants from your nose.  2. The sinus passages are moisturized.  3. Studies have also shown that a nasal irrigation improves cell function (the cells that move the mucus work better).  The recipe  Use a one-quart glass jar that is  thoroughly cleansed.  You may use a large medical syringe (30 cc), water pick with an irrigation tip (preferred method), squeeze bottle, or Neti pot. Do not use a baby bulb syringe. The syringe or pick should be sterilized frequently or replaced every two to three weeks to avoid contamination and infection.  Fill with water that has been distilled, previously boiled, or otherwise sterilized. Plain tap water is not recommended, because it is not necessarily sterile.  Add 1 to 1 heaping teaspoons of pickling/canning salt. Do not use table salt, because it contains a large number of additives.  Add 1 teaspoon of baking soda (pure bicarbonate).  Mix ingredients together, and store at room temperature. Discard after one week.  You may also make up a solution from premixed packets that are commercially prepared specifically for nasal irrigation.  The instructions  Irrigate your nose with saline one to two times per day.   If you have been told to use nasal medication, you should always use your saline solution first. The nasal medication is much more effective when sprayed onto clean nasal membranes, and the spray will reach deeper into the nose.   Pour the amount of fluid you plan to use into a clean bowl. Do not put your used syringe back into the storage container, because it contaminates your solution.   You may warm the solution slightly in the microwave, but be sure that the solution is not hot.   Bend over the sink (some people do this in the shower), and squirt the solution into each side of your nose, aiming the stream toward the back of your head, not the top of your head. The solution should flow into one  nostril and out of the other, but it will not harm you if you swallow a little.   Some people experience a little burning sensation the first few times that they use buffered saline solution, but this usually goes away after they adapt to it.   Thank you for coming in today. I hope you  feel we met your needs.  Feel free to call PCP if you have any questions or further requests.  Please consider signing up for MyChart if you do not already have it, as this is a great way to communicate with me.  Best,  Whitney McVey, PA-C   IF you received an x-ray today, you will receive an invoice from [facility]. Please contact [facility] at [phone] with questions or concerns regarding your invoice.   IF you received labwork today, you will receive an invoice from [facility]. Please contact [facility] at [phone] with questions or concerns regarding your invoice.   Our billing staff will not be able to assist you with questions regarding bills from these companies.  You will be contacted with the lab results as soon as they are available. The fastest way to get your results is to activate your My Chart account. Instructions are located on the last page of this paperwork. If you have not heard from [place] regarding the results in 2 weeks, please contact this office.

## 2017-06-17 NOTE — Telephone Encounter (Signed)
Copied from [place] [id number]. Topic: General - Other >> Jun 17, 2017  3:13 PM Carrie Hess, [place] wrote: Reason for CRM: pt called and wanted a nurse to call the pharmacy [facility] on [address] because she stated that she wanted prescriptions for her medications and that they gave her OTC medications  [phone] pt can be reached on her cell

## 2017-06-18 NOTE — Telephone Encounter (Signed)
Duplicate message. 

## 2017-06-18 NOTE — Telephone Encounter (Signed)
I spoke with the patient and she stated she would like to be prescribed a medication that her insurance will pay for not something the pharmacist gets of the shelf in the store because he insurance won't pay for OTC medications and she can't afford to pay for it out of pocket. Please send them to [place]

## 2017-06-22 ENCOUNTER — Other Ambulatory Visit: Payer: Self-pay | Admitting: Physician Assistant

## 2017-06-22 DIAGNOSIS — T148XXA Other injury of unspecified body region, initial encounter: Secondary | ICD-10-CM

## 2017-06-22 MED ORDER — KETOPROFEN 10 % EX CREA: 1.0000 "application " | TOPICAL_CREAM | Freq: Four times a day (QID) | CUTANEOUS | 0 refills | Status: DC

## 2017-06-22 MED ORDER — KETOPROFEN 10 % EX CREA: 1.0000 "application " | TOPICAL_CREAM | Freq: Four times a day (QID) | CUTANEOUS | 0 refills | Status: AC

## 2017-06-22 NOTE — Telephone Encounter (Signed)
Copied from [place] [id number]. Topic: General - Other >> Jun 22, 2017 10:08 AM Percell Belt A wrote: Pt called in to check the status of this message.  She would like to know if these meds could be called in today.    Best number [phone]

## 2017-06-22 NOTE — Telephone Encounter (Signed)
I am not sure which medication she is referring to as I treated her for muscle pain and runny nose. I sent topical NSAID to her pharmacy. Thank you.

## 2017-06-26 ENCOUNTER — Telehealth: Payer: Self-pay | Admitting: Physician Assistant

## 2017-06-26 NOTE — Telephone Encounter (Signed)
Spoke with patient who requested that writer call the pharmacy and ask why she was given OTC pain cream and cough medication. Spoke with pharmacist, states that patient insurance does not cover mucinex, and that Aspercreme is only OTC. Patient given information, demanding another rx that is "covered" Advised patient that PA not in the office, offered appointment with alternate provider, patient agreed. Call transferred to front desk.

## 2017-06-26 NOTE — Telephone Encounter (Signed)
Pt says the prescription she was given was over-the-counter, but she wants an actual prescribed medication for sinus and joint pain. In her original call with Garvin Fila, she told her she wanted to make another appt with an MD instead of a PA.  When I picked up to make the appt she said she needed an appt and she was not going to pay for it.  Also said she just needed to have a conversation.  So I suggested she just leave another message because her insurance would require another copay for an OV.  [phone]

## 2017-06-27 ENCOUNTER — Telehealth: Payer: Self-pay

## 2017-06-27 NOTE — Telephone Encounter (Signed)
Fax request from [facility] [address]. Pharm req alternative to Ketoprofen

## 2019-06-27 ENCOUNTER — Other Ambulatory Visit: Payer: Self-pay | Admitting: Family Medicine

## 2019-06-27 DIAGNOSIS — I10 Essential (primary) hypertension: Secondary | ICD-10-CM

## 2019-07-06 ENCOUNTER — Other Ambulatory Visit: Payer: BC Managed Care – PPO
